# Patient Record
Sex: Female | Born: 1965 | Race: White | Hispanic: No | Smoking: Never smoker
Health system: Southern US, Community
[De-identification: ages and names within clinical notes are randomized; demographics above are authoritative.]

## PROBLEM LIST (undated history)

## (undated) ENCOUNTER — Inpatient Hospital Stay (HOSPITAL_COMMUNITY): Payer: Self-pay

## (undated) DIAGNOSIS — Z789 Other specified health status: Secondary | ICD-10-CM

## (undated) HISTORY — PX: WISDOM TOOTH EXTRACTION: SHX21

---

## 2007-02-27 ENCOUNTER — Ambulatory Visit (HOSPITAL_COMMUNITY): Admission: RE | Admit: 2007-02-27 | Discharge: 2007-02-27 | Payer: Self-pay | Admitting: Obstetrics and Gynecology

## 2007-03-06 ENCOUNTER — Encounter: Admission: RE | Admit: 2007-03-06 | Discharge: 2007-03-06 | Payer: Self-pay | Admitting: Obstetrics and Gynecology

## 2007-03-06 ENCOUNTER — Encounter (INDEPENDENT_AMBULATORY_CARE_PROVIDER_SITE_OTHER): Payer: Self-pay | Admitting: Diagnostic Radiology

## 2009-08-09 ENCOUNTER — Encounter: Admission: RE | Admit: 2009-08-09 | Discharge: 2009-08-09 | Payer: Self-pay | Admitting: Obstetrics and Gynecology

## 2010-02-19 ENCOUNTER — Encounter: Payer: Self-pay | Admitting: Obstetrics and Gynecology

## 2010-08-30 ENCOUNTER — Other Ambulatory Visit: Payer: Self-pay | Admitting: Obstetrics and Gynecology

## 2010-08-30 DIAGNOSIS — N63 Unspecified lump in unspecified breast: Secondary | ICD-10-CM

## 2010-08-30 DIAGNOSIS — N644 Mastodynia: Secondary | ICD-10-CM

## 2010-09-06 ENCOUNTER — Ambulatory Visit
Admission: RE | Admit: 2010-09-06 | Discharge: 2010-09-06 | Disposition: A | Discharge status: Home / Self Care | Payer: BC Managed Care – PPO | Source: Ambulatory Visit | Attending: Obstetrics and Gynecology | Admitting: Obstetrics and Gynecology

## 2010-09-06 DIAGNOSIS — N644 Mastodynia: Secondary | ICD-10-CM

## 2010-09-06 DIAGNOSIS — N63 Unspecified lump in unspecified breast: Secondary | ICD-10-CM

## 2012-01-22 ENCOUNTER — Inpatient Hospital Stay (HOSPITAL_COMMUNITY)
Admission: AD | Admit: 2012-01-22 | Discharge: 2012-01-22 | Disposition: A | Discharge status: Home / Self Care | Payer: BC Managed Care – PPO | Source: Ambulatory Visit | Attending: Obstetrics and Gynecology | Admitting: Obstetrics and Gynecology

## 2012-01-22 ENCOUNTER — Ambulatory Visit: Payer: Self-pay

## 2012-01-22 ENCOUNTER — Encounter (HOSPITAL_COMMUNITY): Payer: Self-pay

## 2012-01-22 ENCOUNTER — Ambulatory Visit (INDEPENDENT_AMBULATORY_CARE_PROVIDER_SITE_OTHER): Payer: BC Managed Care – PPO | Admitting: Family Medicine

## 2012-01-22 ENCOUNTER — Inpatient Hospital Stay (HOSPITAL_COMMUNITY): Payer: BC Managed Care – PPO

## 2012-01-22 VITALS — BP 108/80 | HR 60 | Temp 98.0°F | Resp 20 | Ht 67.0 in | Wt 183.0 lb

## 2012-01-22 DIAGNOSIS — Z3490 Encounter for supervision of normal pregnancy, unspecified, unspecified trimester: Secondary | ICD-10-CM

## 2012-01-22 DIAGNOSIS — N926 Irregular menstruation, unspecified: Secondary | ICD-10-CM

## 2012-01-22 DIAGNOSIS — R109 Unspecified abdominal pain: Secondary | ICD-10-CM | POA: Insufficient documentation

## 2012-01-22 DIAGNOSIS — N939 Abnormal uterine and vaginal bleeding, unspecified: Secondary | ICD-10-CM

## 2012-01-22 DIAGNOSIS — N898 Other specified noninflammatory disorders of vagina: Secondary | ICD-10-CM

## 2012-01-22 DIAGNOSIS — O209 Hemorrhage in early pregnancy, unspecified: Secondary | ICD-10-CM | POA: Insufficient documentation

## 2012-01-22 DIAGNOSIS — O021 Missed abortion: Secondary | ICD-10-CM

## 2012-01-22 DIAGNOSIS — O00109 Unspecified tubal pregnancy without intrauterine pregnancy: Secondary | ICD-10-CM | POA: Insufficient documentation

## 2012-01-22 DIAGNOSIS — Z331 Pregnant state, incidental: Secondary | ICD-10-CM

## 2012-01-22 DIAGNOSIS — R102 Pelvic and perineal pain: Secondary | ICD-10-CM

## 2012-01-22 DIAGNOSIS — O039 Complete or unspecified spontaneous abortion without complication: Secondary | ICD-10-CM | POA: Insufficient documentation

## 2012-01-22 DIAGNOSIS — N949 Unspecified condition associated with female genital organs and menstrual cycle: Secondary | ICD-10-CM

## 2012-01-22 HISTORY — DX: Other specified health status: Z78.9

## 2012-01-22 LAB — POCT CBC
Granulocyte percent: 91.3 %G — AB (ref 37–80)
MCH, POC: 30.9 pg (ref 27–31.2)
MID (cbc): 0.4 (ref 0–0.9)
MPV: 8.4 fL (ref 0–99.8)
POC MID %: 2.1 %M (ref 0–12)
Platelet Count, POC: 281 10*3/uL (ref 142–424)
RBC: 4.21 M/uL (ref 4.04–5.48)
WBC: 17.6 10*3/uL — AB (ref 4.6–10.2)

## 2012-01-22 MED ORDER — DOXYCYCLINE HYCLATE 100 MG PO TABS
100.0000 mg | ORAL_TABLET | ORAL | Status: AC
  Administered 2012-01-22: 100 mg via ORAL
  Filled 2012-01-22: qty 1

## 2012-01-22 MED ORDER — MISOPROSTOL 200 MCG PO TABS
800.0000 ug | ORAL_TABLET | Freq: Once | ORAL | Status: AC
  Administered 2012-01-22: 800 ug via VAGINAL
  Filled 2012-01-22: qty 4

## 2012-01-22 MED ORDER — DOXYCYCLINE HYCLATE 100 MG PO TABS: 100.0000 mg | ORAL_TABLET | Freq: Two times a day (BID) | ORAL | Status: DC

## 2012-01-22 MED ORDER — KETOROLAC TROMETHAMINE 60 MG/2ML IM SOLN
60.0000 mg | INTRAMUSCULAR | Status: AC
  Administered 2012-01-22: 60 mg via INTRAMUSCULAR
  Filled 2012-01-22: qty 2

## 2012-01-22 MED ORDER — HYDROCODONE-ACETAMINOPHEN 5-500 MG PO TABS: 1.0000 | ORAL_TABLET | Freq: Four times a day (QID) | ORAL | Status: DC | PRN

## 2012-01-22 MED ORDER — IBUPROFEN 800 MG PO TABS: 800.0000 mg | ORAL_TABLET | Freq: Three times a day (TID) | ORAL | Status: DC | PRN

## 2012-01-22 NOTE — MAU Note (Signed)
Dr. Cherly Hensen returned page, pt report given, orders to obtain ABO/RH, BHCG, and u/s. Dr. Cherly Hensen to see pt in MAU.

## 2012-01-22 NOTE — Progress Notes (Signed)
sono showed 6 week fetal demise in LUS. Undesired pregnancy  IMP: Missed abortion Pt given option of surgical mgmt( D&E) vs medical mgmt w/ cytotec. Pt prefers the latter. Will have RN insert cytotec per vagina. SAB precautions reviewed. Will start on doxycycline 100  Mg po bid x 7 days. F/u office 12/30 for sono and /or h quant. Will reinitiate OCP when preg resolved. Script for vicodin and motrin,. Pt will be instructed on when 1st motrin can be taken post toradol use

## 2012-01-22 NOTE — MAU Note (Signed)
Vanessa Shields, CNM notified by RN that pt is here with early pregnancy pain and bleeding, sent from urgent care, RN unsure who will assume care. CNM notified RN to call Dr. Billy Coast, then Dr. Cherly Hensen if Dr. Billy Coast is unable to evaluate pt.

## 2012-01-22 NOTE — MAU Note (Signed)
Pt states irregular cycle for past 2 months, thought she was going into menopause, pain began at 1000 am, is constant. Went to [place] urgent care this am, and had pelvic exam done, multiple clots noted then, feels slightly better since that exam, however is guarding from pain. For past 2 months has had dark blood/spotting. Pain is constant, rates 7/10.

## 2012-01-22 NOTE — Progress Notes (Signed)
[address]   [place], [place]  [address]   [phone]  Subjective:    Patient ID: Vanessa Shields, female    DOB: [birth year], 46 y.o.   MRN: [id number]  HPIThis 46 y.o. female presents for evaluation of severe abdominal pain.  At grocery store with acute onset of B lower abdominal pain L>R pain.  No menses in months.  Spotting irregularly intermittently; went to bathroom and has been gushing vaginal blood; now is just stopped.  No fever but +chills; no sweats.  No nausea, vomiting, diarrhea.  Had B.M. This morning but rectum felt funny.  No constipation; b.m. Yesterday and normal.   No urinary symptoms; no hematuria, urgency, frequency,nocturia, incontinence.  Sexually active; no contraception.  Last normal menses 3-4 months ago; not normal; only spotting.  Changing positions a lot to get comfortable.  Appetite fine until onset of pain.  Severity 8-9/10.  No medication for pain.  No history of STDs in past; sexually active.  PCP:  Deboraha Sprang  Physician GYN:  Billy Coast   Review of Systems  Constitutional: Positive for chills and diaphoresis. Negative for fever and fatigue.  Respiratory: Negative for shortness of breath.   Cardiovascular: Negative for chest pain.  Gastrointestinal: Positive for abdominal pain. Negative for nausea, vomiting, diarrhea, constipation, blood in stool, anal bleeding and rectal pain.  Genitourinary: Positive for vaginal bleeding and pelvic pain. Negative for dysuria, urgency, frequency, hematuria, flank pain, vaginal discharge, genital sores and vaginal pain.    History reviewed. No pertinent past medical history.  History reviewed. No pertinent past surgical history.  Prior to Admission medications   Not on File    Allergies  Allergen Reactions  . Penicillins     History   Social History  . Marital Status: Divorced    Spouse Name: N/A    Number of Children: N/A  . Years of Education: N/A   Occupational History  . Not on file.   Social History Main  Topics  . Smoking status: Never Smoker   . Smokeless tobacco: Not on file  . Alcohol Use: No  . Drug Use: No  . Sexually Active: Yes   Other Topics Concern  . Not on file   Social History Narrative   Marital status: divorced; but dating   Children: 2 children (14, 68)   Employment: Runner, broadcasting/film/video.    History reviewed. No pertinent family history.      History reviewed. No pertinent past medical history.  History reviewed. No pertinent past surgical history.  Prior to Admission medications   Not on File    Allergies  Allergen Reactions  . Penicillins     History   Social History  . Marital Status: Divorced    Spouse Name: N/A    Number of Children: N/A  . Years of Education: N/A   Occupational History  . Not on file.   Social History Main Topics  . Smoking status: Never Smoker   . Smokeless tobacco: Not on file  . Alcohol Use: No  . Drug Use: No  . Sexually Active: Yes   Other Topics Concern  . Not on file   Social History Narrative   Marital status: divorced; but dating   Children: 2 children (14, 68)   Employment: Runner, broadcasting/film/video.    History reviewed. No pertinent family history.  Objective:   Physical Exam  Nursing note and vitals reviewed. Constitutional: She is oriented to person, place, and time. She appears well-developed and well-nourished. She appears distressed.  Changes position frequently during history collection.  Having difficult time getting comfortable.  Distressed due to pain.  Cardiovascular: Normal rate, regular rhythm and normal heart sounds.   Pulmonary/Chest: Effort normal and breath sounds normal.  Abdominal: Soft. Bowel sounds are normal. She exhibits no distension and no mass. There is no hepatosplenomegaly. There is tenderness in the right lower quadrant and left lower quadrant. There is no rebound, no guarding and no CVA tenderness. No hernia.  Genitourinary: There is no rash, tenderness or lesion on the right labia. There is no rash,  tenderness or lesion on the left labia. Uterus is enlarged and tender. Cervix exhibits no motion tenderness. Right adnexum displays no mass, no tenderness and no fullness. Left adnexum displays no mass, no tenderness and no fullness. There is bleeding around the vagina. No erythema or tenderness around the vagina. No foreign body around the vagina. No vaginal discharge found.       Large bleeding with insertion of speculum; passing multiple medium sized clots.  Difficulty visualizing os due to active continuous bleeding.  Uterus mildly tender to palpation; feels enlarged.  Neurological: She is alert and oriented to person, place, and time.  Skin: No rash noted. She is not diaphoretic.  Psychiatric: She has a normal mood and affect. Her behavior is normal.    Results for orders placed in visit on 01/22/12  POCT URINE PREGNANCY      Component Value Range   Preg Test, Ur Positive    POCT CBC      Component Value Range   WBC 17.6 (*) 4.6 - 10.2 K/uL   Lymph, poc 1.2  0.6 - 3.4   POC LYMPH PERCENT 6.6 (*) 10 - 50 %L   MID (cbc) 0.4  0 - 0.9   POC MID % 2.1  0 - 12 %M   POC Granulocyte 16.1 (*) 2 - 6.9   Granulocyte percent 91.3 (*) 37 - 80 %G   RBC 4.21  4.04 - 5.48 M/uL   Hemoglobin 13.0  12.2 - 16.2 g/dL   HCT, POC 78.2  95.6 - 47.9 %   MCV 99.3 (*) 80 - 97 fL   MCH, POC 30.9  27 - 31.2 pg   MCHC 31.1 (*) 31.8 - 35.4 g/dL   RDW, POC 21.3     Platelet Count, POC 281  142 - 424 K/uL   MPV 8.4  0 - 99.8 fL       Assessment & Plan:   1. Vaginal bleeding  POCT urine pregnancy, POCT CBC  2. Pelvic pain in female  POCT urine pregnancy, POCT CBC  3. Pregnancy    4. Irregular menses       1.  Pelvic pain:  New.  Associated with new onset vaginal bleeding, positive urine pregnancy test.  Refer to [facility] for stat pelvic ultrasound to evaluate for ectopic pregnancy.  Spoke with MAU; pt to present immediately for evaluation. 2.  Vaginal Bleeding:  New onset in office; associated  with pelvic pain, positive pregnancy test.  Refer for stat pelvic ultrasound to evaluate for ectopic pregnancy versus SAB. 3.  Pregnancy:  New.  With severe pelvic pain, bleeding. 4. Irregular menses: onset in past four months.  Positive pregnancy test.

## 2012-01-22 NOTE — MAU Provider Note (Addendum)
History   cc: abdominal pain, vaginal bleeding  Chief Complaint  Patient presents with  . Vaginal Bleeding  . Abdominal Pain    HPI: 46 yo [id number] DWF unsure LMP (+) UPT @ urgent care presents for further evaluation of lower abdominal pain and vaginal bleeding. Pt presented to urgent care for cc/o severe abdominal pain started today @ 10 am,. While she was being examined, pt had gush of vaginal bleeding and had been bleeding since that time. Pt was off OCp. She cannot recall last normal cycle. Pt has been having brown spotting for several months.  OB History    Grav Para Term Preterm Abortions TAB SAB Ect Mult Living   3 2 2       2       Past Medical History  Diagnosis Date  . No pertinent past medical history     Past Surgical History  Procedure Date  . Wisdom tooth extraction     History reviewed. No pertinent family history.  History  Substance Use Topics  . Smoking status: Never Smoker   . Smokeless tobacco: Never Used  . Alcohol Use: No    Allergies:  Allergies  Allergen Reactions  . Penicillins Other (See Comments)    unknown    No prescriptions prior to admission     Physical Exam   Blood pressure 97/47, pulse 68, temperature 97.1 F (36.2 C), temperature source Oral, resp. rate 16, height 5' 7.75" (1.721 m), weight 69.967 kg (154 lb 4 oz), last menstrual period 01/22/2012, SpO2 100.00%.  General appearance: alert, cooperative and mild distress Abdomen: soft tender active BS Pelvic: external genitalia normal and vagina: filled w/ blood and clot. cervix ft dilated w/ ? POC at os, uterus AV 7- week size adnexa nontender ED Course   Vaginal bleeding in preg ? Incomplete/impending SAB. R/o ectopic pregnancy P) Hquant, sonogram . ABO/RH typing  MDM   Vanessa Shields A, MD 4:02 PM 01/22/2012      addendum: blood type O positive. H quant 1667. Pt in sonogram

## 2012-01-22 NOTE — MAU Note (Signed)
Dr. Billy Coast notified of pt in MAU, Dr. Cherly Hensen is on call, will evaluate pt. Dr. Cherly Hensen paged.

## 2012-07-03 ENCOUNTER — Other Ambulatory Visit: Payer: Self-pay

## 2012-07-03 DIAGNOSIS — Z1231 Encounter for screening mammogram for malignant neoplasm of breast: Secondary | ICD-10-CM

## 2012-07-07 ENCOUNTER — Ambulatory Visit
Admission: RE | Admit: 2012-07-07 | Discharge: 2012-07-07 | Disposition: A | Discharge status: Home / Self Care | Payer: BC Managed Care – PPO | Source: Ambulatory Visit

## 2012-07-07 DIAGNOSIS — Z1231 Encounter for screening mammogram for malignant neoplasm of breast: Secondary | ICD-10-CM

## 2012-11-26 ENCOUNTER — Encounter (HOSPITAL_COMMUNITY): Payer: Self-pay | Admitting: *Deleted

## 2013-11-30 ENCOUNTER — Encounter (HOSPITAL_COMMUNITY): Payer: Self-pay | Admitting: *Deleted

## 2014-03-03 ENCOUNTER — Ambulatory Visit (INDEPENDENT_AMBULATORY_CARE_PROVIDER_SITE_OTHER): Payer: Self-pay | Admitting: Physician Assistant

## 2014-03-03 VITALS — BP 104/64 | HR 80 | Temp 98.1°F | Resp 16 | Ht 67.0 in | Wt 163.4 lb

## 2014-03-03 DIAGNOSIS — H00013 Hordeolum externum right eye, unspecified eyelid: Secondary | ICD-10-CM

## 2014-03-03 DIAGNOSIS — H109 Unspecified conjunctivitis: Secondary | ICD-10-CM

## 2014-03-03 MED ORDER — POLYMYXIN B-TRIMETHOPRIM 10000-0.1 UNIT/ML-% OP SOLN: 1.0000 [drp] | OPHTHALMIC | Status: AC

## 2014-03-03 NOTE — Patient Instructions (Signed)
Use eye drops every 4 hours for 7 days. Continue with warm compresses. Stop wearing contacts and throw them away. Wear glasses until symptoms resolve and put in new contacts at that time. Return with further problems.

## 2014-03-03 NOTE — Progress Notes (Signed)
Subjective:    Patient ID: Vanessa Shields, female    DOB: 06/28/1965, 49 y.o.   MRN: [id number]  HPI  This is a 49 year old female presenting with 5 days of right eyelid swelling and 1 day of left eyelid swelling. She reports the eyelids feel tender. Today her right conjunctiva became red and noticed this morning her right eye was crusted shut upon waking. She wears contacts and has continued to wear them during this time. She denies blurry vision, eye pain or itching, URI symptoms. She started using warm compresses today and states it made her eyelids better. Never had anything like this happen before. She denies fever or chills.  Review of Systems  Constitutional: Negative for fever and chills.  HENT: Negative for congestion, ear pain, sinus pressure and sore throat.   Eyes: Positive for discharge and redness. Negative for pain, itching and visual disturbance.  Respiratory: Negative for cough.   Skin: Positive for color change.  Neurological: Negative for headaches.  Hematological: Negative for adenopathy.    There are no active problems to display for this patient.  Prior to Admission medications   Medication Sig Start Date End Date Taking? Authorizing Provider  ALPRAZolam Prudy Feeler) 0.5 MG tablet Take 0.5 mg by mouth at bedtime as needed for anxiety.   Yes Historical Provider, MD  valACYclovir (VALTREX) 1000 MG tablet Take 1,000 mg by mouth 2 (two) times daily.   Yes Historical Provider, MD   Allergies  Allergen Reactions  . Penicillins Other (See Comments)    unknown   Patient's social and family history were reviewed.     Objective:   Physical Exam  Constitutional: She is oriented to person, place, and time. She appears well-developed and well-nourished. No distress.  HENT:  Head: Normocephalic and atraumatic.  Right Ear: Hearing, tympanic membrane, external ear and ear canal normal.  Left Ear: Hearing, tympanic membrane, external ear and ear canal normal.  Nose: Nose  normal. Right sinus exhibits no maxillary sinus tenderness and no frontal sinus tenderness. Left sinus exhibits no maxillary sinus tenderness and no frontal sinus tenderness.  Mouth/Throat: Uvula is midline, oropharynx is clear and moist and mucous membranes are normal.  Eyes: EOM are normal. Pupils are equal, round, and reactive to light. Right eye exhibits discharge. Left eye exhibits no discharge. Right conjunctiva is injected. Left conjunctiva is not injected. No scleral icterus.  Right eyelid erythematous and swollen with inward stye present. Right eye with purulent material in the medial corner. Left eyelid erythematous and swollen with no stye present. No purulent drainage noted from the left eye.  Cardiovascular: Normal rate and regular rhythm.   Pulmonary/Chest: Effort normal and breath sounds normal. No respiratory distress.  Musculoskeletal: Normal range of motion.  Lymphadenopathy:    She has no cervical adenopathy.  Neurological: She is alert and oriented to person, place, and time.  Skin: Skin is warm, dry and intact. No lesion and no rash noted.  Psychiatric: She has a normal mood and affect. Her speech is normal and behavior is normal. Thought content normal.    Visual Acuity Screening   Right eye Left eye Both eyes  Without correction:     With correction:    BP 104/64 mmHg  Pulse 80  Temp(Src) 98.1 F (36.7 C) (Oral)  Resp 16  Ht  (1.702 m)  Wt 163 lb 6.4 oz (74.118 kg)  BMI 25.59 kg/m2  SpO2 98%     Assessment & Plan:  1. Bilateral conjunctivitis 2. Stye, right Pt likely has bilateral bacterial conjunctivitis and right stye. Will treat with abx eye drops and warm compresses. Advised she throw away current contacts and wear glasses until symptoms resolve.  - trimethoprim-polymyxin b (POLYTRIM) ophthalmic solution; Place 1 drop into both eyes every 4 (four) hours.  Dispense: 10 mL; Refill: 0    Roswell MinersNicole V. Dyke BrackettBush, PA-C, MHS [facility]  03/03/2014

## 2014-05-05 ENCOUNTER — Ambulatory Visit (INDEPENDENT_AMBULATORY_CARE_PROVIDER_SITE_OTHER): Payer: BLUE CROSS/BLUE SHIELD | Admitting: Physician Assistant

## 2014-05-05 VITALS — BP 118/68 | HR 71 | Temp 98.7°F | Resp 18 | Ht 69.0 in | Wt 161.0 lb

## 2014-05-05 DIAGNOSIS — B9789 Other viral agents as the cause of diseases classified elsewhere: Principal | ICD-10-CM

## 2014-05-05 DIAGNOSIS — J069 Acute upper respiratory infection, unspecified: Secondary | ICD-10-CM

## 2014-05-05 MED ORDER — LORATADINE-PSEUDOEPHEDRINE ER 5-120 MG PO TB12: 1.0000 | ORAL_TABLET | Freq: Two times a day (BID) | ORAL | Status: AC

## 2014-05-05 MED ORDER — HYDROCODONE-HOMATROPINE 5-1.5 MG/5ML PO SYRP: 5.0000 mL | ORAL_SOLUTION | Freq: Three times a day (TID) | ORAL | Status: AC | PRN

## 2014-05-05 MED ORDER — NAPROXEN 500 MG PO TABS: 500.0000 mg | ORAL_TABLET | Freq: Two times a day (BID) | ORAL | Status: AC

## 2014-05-05 NOTE — Progress Notes (Signed)
05/05/2014 at 8:07 PM  Vanessa Shields / DOB: 1965-04-08 / MRN: [id number]  The patient  does not have a problem list on file.  SUBJECTIVE  Sore Throat  This is a new problem. The current episode started in the past 7 days. The problem has been unchanged. Neither side of throat is experiencing more pain than the other. There has been no fever. The pain is moderate. Associated symptoms include congestion, coughing and headaches. Pertinent negatives include no abdominal pain, hoarse voice, neck pain, shortness of breath, swollen glands, trouble swallowing or vomiting. She has had no exposure to strep or mono. She has tried gargles and NSAIDs for the symptoms. The treatment provided significant relief.  Cough Associated symptoms include headaches. Pertinent negatives include no shortness of breath.     She  has a past medical history of No pertinent past medical history.    She  has a current medication list which includes the following prescription(s): alprazolam, valacyclovir, hydrocodone-homatropine, loratadine-pseudoephedrine, and naproxen.  Ms. Vanessa Shields is allergic to penicillins. She  reports that she has never smoked. She has never used smokeless tobacco. She reports that she does not drink alcohol or use illicit drugs. She  reports that she currently engages in sexual activity. She reports using the following method of birth control/protection: None. The patient  has past surgical history that includes Wisdom tooth extraction.  Her family history is not on file.  Review of Systems  HENT: Positive for congestion. Negative for hoarse voice and trouble swallowing.   Respiratory: Positive for cough. Negative for shortness of breath.   Gastrointestinal: Negative for vomiting and abdominal pain.  Musculoskeletal: Negative for neck pain.  Neurological: Positive for headaches.    OBJECTIVE  Her  height is 5\' 9"  (1.753 m) and weight is 161 lb (73.029 kg). Her oral temperature is 98.7 F (37.1  C). Her blood pressure is 118/68 and her pulse is 71. Her respiration is 18 and oxygen saturation is 98%.  The patient's body mass index is 23.76 kg/(m^2).  Physical Exam  Constitutional: She is oriented to person, place, and time. She appears well-developed and well-nourished. No distress.  HENT:  Right Ear: Hearing, tympanic membrane, external ear and ear canal normal.  Left Ear: Hearing, tympanic membrane, external ear and ear canal normal.  Nose: Mucosal edema present. Right sinus exhibits no maxillary sinus tenderness and no frontal sinus tenderness. Left sinus exhibits no maxillary sinus tenderness and no frontal sinus tenderness.  Mouth/Throat: Uvula is midline, oropharynx is clear and moist and mucous membranes are normal.  Cardiovascular: Normal rate, regular rhythm and normal heart sounds.   Respiratory: Effort normal and breath sounds normal. She has no wheezes. She has no rales.  Neurological: She is alert and oriented to person, place, and time.  Skin: Skin is warm and dry. She is not diaphoretic.  Psychiatric: She has a normal mood and affect.    No results found for this or any previous visit (from the past 24 hour(s)).  ASSESSMENT & PLAN  Vanessa Shields was seen today for sore throat, cough and chills.  Diagnoses and all orders for this visit:  Viral URI with cough Orders: -     loratadine-pseudoephedrine (CLARITIN-D 12 HOUR) 5-120 MG per tablet; Take 1 tablet by mouth 2 (two) times daily. -     naproxen (NAPROSYN) 500 MG tablet; Take 1 tablet (500 mg total) by mouth 2 (two) times daily with a meal. -     HYDROcodone-homatropine (HYCODAN) 5-1.5 MG/5ML syrup;  Take 5 mLs by mouth every 8 (eight) hours as needed for cough.    The patient was advised to call or come back to clinic if she does not see an improvement in symptoms, or worsens with the above plan.   Deliah Boston, MHS, PA-C [facility] 05/05/2014 8:07 PM

## 2014-05-05 NOTE — Patient Instructions (Signed)
Upper Respiratory Infection, Adult An upper respiratory infection (URI) is also sometimes known as the common cold. The upper respiratory tract includes the nose, sinuses, throat, trachea, and bronchi. Bronchi are the airways leading to the lungs. Most people improve within 1 week, but symptoms can last up to 2 weeks. A residual cough may last even longer.  CAUSES Many different viruses can infect the tissues lining the upper respiratory tract. The tissues become irritated and inflamed and often become very moist. Mucus production is also common. A cold is contagious. You can easily spread the virus to others by oral contact. This includes kissing, sharing a glass, coughing, or sneezing. Touching your mouth or nose and then touching a surface, which is then touched by another person, can also spread the virus. SYMPTOMS  Symptoms typically develop 1 to 3 days after you come in contact with a cold virus. Symptoms vary from person to person. They may include:  Runny nose.  Sneezing.  Nasal congestion.  Sinus irritation.  Sore throat.  Loss of voice (laryngitis).  Cough.  Fatigue.  Muscle aches.  Loss of appetite.  Headache.  Low-grade fever. DIAGNOSIS  You might diagnose your own cold based on familiar symptoms, since most people get a cold 2 to 3 times a year. Your caregiver can confirm this based on your exam. Most importantly, your caregiver can check that your symptoms are not due to another disease such as strep throat, sinusitis, pneumonia, asthma, or epiglottitis. Blood tests, throat tests, and X-rays are not necessary to diagnose a common cold, but they may sometimes be helpful in excluding other more serious diseases. Your caregiver will decide if any further tests are required. RISKS AND COMPLICATIONS  You may be at risk for a more severe case of the common cold if you smoke cigarettes, have chronic heart disease (such as heart failure) or lung disease (such as asthma), or if  you have a weakened immune system. The very young and very old are also at risk for more serious infections. Bacterial sinusitis, middle ear infections, and bacterial pneumonia can complicate the common cold. The common cold can worsen asthma and chronic obstructive pulmonary disease (COPD). Sometimes, these complications can require emergency medical care and may be life-threatening. PREVENTION  The best way to protect against getting a cold is to practice good hygiene. Avoid oral or hand contact with people with cold symptoms. Wash your hands often if contact occurs. There is no clear evidence that vitamin C, vitamin E, echinacea, or exercise reduces the chance of developing a cold. However, it is always recommended to get plenty of rest and practice good nutrition. TREATMENT  Treatment is directed at relieving symptoms. There is no cure. Antibiotics are not effective, because the infection is caused by a virus, not by bacteria. Treatment may include:  Increased fluid intake. Sports drinks offer valuable electrolytes, sugars, and fluids.  Breathing heated mist or steam (vaporizer or shower).  Eating chicken soup or other clear broths, and maintaining good nutrition.  Getting plenty of rest.  Using gargles or lozenges for comfort.  Controlling fevers with ibuprofen or acetaminophen as directed by your caregiver.  Increasing usage of your inhaler if you have asthma. Zinc gel and zinc lozenges, taken in the first 24 hours of the common cold, can shorten the duration and lessen the severity of symptoms. Pain medicines may help with fever, muscle aches, and throat pain. A variety of non-prescription medicines are available to treat congestion and runny nose. Your caregiver   can make recommendations and may suggest nasal or lung inhalers for other symptoms.  HOME CARE INSTRUCTIONS   Only take over-the-counter or prescription medicines for pain, discomfort, or fever as directed by your  caregiver.  Use a warm mist humidifier or inhale steam from a shower to increase air moisture. This may keep secretions moist and make it easier to breathe.  Drink enough water and fluids to keep your urine clear or pale yellow.  Rest as needed.  Return to work when your temperature has returned to normal or as your caregiver advises. You may need to stay home longer to avoid infecting others. You can also use a face mask and careful hand washing to prevent spread of the virus. SEEK MEDICAL CARE IF:   After the first few days, you feel you are getting worse rather than better.  You need your caregiver's advice about medicines to control symptoms.  You develop chills, worsening shortness of breath, or brown or red sputum. These may be signs of pneumonia.  You develop yellow or brown nasal discharge or pain in the face, especially when you bend forward. These may be signs of sinusitis.  You develop a fever, swollen neck glands, pain with swallowing, or white areas in the back of your throat. These may be signs of strep throat. SEEK IMMEDIATE MEDICAL CARE IF:   You have a fever.  You develop severe or persistent headache, ear pain, sinus pain, or chest pain.  You develop wheezing, a prolonged cough, cough up blood, or have a change in your usual mucus (if you have chronic lung disease).  You develop sore muscles or a stiff neck. Document Released: 07/11/2000 Document Revised: 04/09/2011 Document Reviewed: 04/22/2013 Exit[employer] Patient Information 2015 [employer]. This information is not intended to replace advice given to you by your health care provider. Make sure you discuss any questions you have with your health care provider.  

## 2014-05-06 ENCOUNTER — Ambulatory Visit (INDEPENDENT_AMBULATORY_CARE_PROVIDER_SITE_OTHER): Payer: BLUE CROSS/BLUE SHIELD | Admitting: Family Medicine

## 2014-05-06 ENCOUNTER — Ambulatory Visit (INDEPENDENT_AMBULATORY_CARE_PROVIDER_SITE_OTHER): Payer: BLUE CROSS/BLUE SHIELD

## 2014-05-06 VITALS — BP 110/58 | HR 103 | Temp 100.4°F | Resp 20 | Ht 69.0 in | Wt 161.0 lb

## 2014-05-06 DIAGNOSIS — J029 Acute pharyngitis, unspecified: Secondary | ICD-10-CM | POA: Diagnosis not present

## 2014-05-06 DIAGNOSIS — R059 Cough, unspecified: Secondary | ICD-10-CM

## 2014-05-06 DIAGNOSIS — R509 Fever, unspecified: Secondary | ICD-10-CM

## 2014-05-06 DIAGNOSIS — R05 Cough: Secondary | ICD-10-CM

## 2014-05-06 DIAGNOSIS — J208 Acute bronchitis due to other specified organisms: Secondary | ICD-10-CM | POA: Diagnosis not present

## 2014-05-06 LAB — POCT CBC
GRANULOCYTE PERCENT: 90.8 % — AB (ref 37–80)
HEMATOCRIT: 42.6 % (ref 37.7–47.9)
Hemoglobin: 14 g/dL (ref 12.2–16.2)
Lymph, poc: 0.5 — AB (ref 0.6–3.4)
MCH, POC: 31.3 pg — AB (ref 27–31.2)
MCHC: 32.8 g/dL (ref 31.8–35.4)
MCV: 95.6 fL (ref 80–97)
MID (CBC): 0.1 (ref 0–0.9)
MPV: 7.2 fL (ref 0–99.8)
PLATELET COUNT, POC: 197 10*3/uL (ref 142–424)
POC Granulocyte: 5.7 (ref 2–6.9)
POC LYMPH %: 8.1 % — AB (ref 10–50)
POC MID %: 1.1 %M (ref 0–12)
RBC: 4.46 M/uL (ref 4.04–5.48)
RDW, POC: 13.2 %
WBC: 6.3 10*3/uL (ref 4.6–10.2)

## 2014-05-06 LAB — POCT RAPID STREP A (OFFICE): Rapid Strep A Screen: NEGATIVE

## 2014-05-06 MED ORDER — MAGIC MOUTHWASH W/LIDOCAINE: ORAL | Status: AC

## 2014-05-06 MED ORDER — AZITHROMYCIN 250 MG PO TABS: ORAL_TABLET | ORAL | Status: AC

## 2014-05-06 NOTE — Progress Notes (Signed)
Subjective: 49 year old lady who comes in here with a sore throat and fever and cough. She was here yesterday and treated for viral etiology. However the throat is gotten so much worse that she can hardly swallow. There is a cutting feeling like it was strep throat. She was exposed to someone up in [place], a foreign Therapist, [employer] student, few days ago over the weekend. She does not smoke. She teaches violin, has [employer], [employer] student. She has been febrile. She took some of the hydrocodone syrup this morning, and 201 Aleve this afternoon. Something for pain.  Objective: Ill-appearing. TMs normal. Throat little erythematous but no exudate. Neck supple. Some anterior cervical nodes. Chest has coarse rhonchi more in the right upper lung area some soft wheezes possible. She kept coughing and was hard to auscultate. Skin is warm to touch.  Assessment: Fever Sore throat Cough  Plan: Strep test, CBC, chest x-ray, throat culture if needed  Results for orders placed or performed in visit on 05/06/14  POCT CBC  Result Value Ref Range   WBC 6.3 4.6 - 10.2 K/uL   Lymph, poc 0.5 (A) 0.6 - 3.4   POC LYMPH PERCENT 8.1 (A) 10 - 50 %L   MID (cbc) 0.1 0 - 0.9   POC MID % 1.1 0 - 12 %M   POC Granulocyte 5.7 2 - 6.9   Granulocyte percent 90.8 (A) 37 - 80 %G   RBC 4.46 4.04 - 5.48 M/uL   Hemoglobin 14.0 12.2 - 16.2 g/dL   HCT, POC 40.942.6 81.137.7 - 47.9 %   MCV 95.6 80 - 97 fL   MCH, POC 31.3 (A) 27 - 31.2 pg   MCHC 32.8 31.8 - 35.4 g/dL   RDW, POC 91.413.2 %   Platelet Count, POC 197 142 - 424 K/uL   MPV 7.2 0 - 99.8 fL  POCT rapid strep A  Result Value Ref Range   Rapid Strep A Screen Negative Negative   [facility] reading (PRIMARY) by  Dr. Alwyn RenHopper Mild rml infiltrate  Bronchitis, poss. early pneumonitis.  Probably a viral process but did go ahead and give her azithromycin because of the little bit of infiltrate possible in the right middle lobe. She complains a lot of sore throat. Gave Magic mouthwash. Can take Tylenol  and/or ibuprofen. Rest. Fluids.

## 2014-05-06 NOTE — Patient Instructions (Addendum)
Drink plenty of fluids and get enough rest  Take the hydrocodone cough syrup 1 teaspoon every 4 hours as needed for cough  Use the Magic mouthwash 2 teaspoons swish and swallow or gargle and spit every 3-4 hours as needed for sore throat. Do not exceed 6 doses in 24 hours.  The azithromycin 2 pills initially, then 1 daily for 4 days  Take ibuprofen maximum 800 mg 3 times daily only if needed for fever and pain  Take Tylenol maximum 1000 mg 3 times daily only if needed for fever or pain  Return or go to the emergency room if worse  You can expect this to take several more days before it is really running its course.

## 2014-05-08 LAB — CULTURE, GROUP A STREP: Organism ID, Bacteria: NORMAL

## 2014-05-30 IMAGING — US US OB COMP LESS 14 WK
1 series · 14 of 28 positions shown · non-contrast
Comparison: None

CLINICAL DATA: Bleeding, pelvic pain, positive urine pregnancy
test

OBSTETRICAL ULTRASOUND <14 WKS AND TRANSVAGINAL OB US:

[Series 1: us ob comp less 14 wks · 48 acquisitions, 14 frames shown]
[im 2/48]
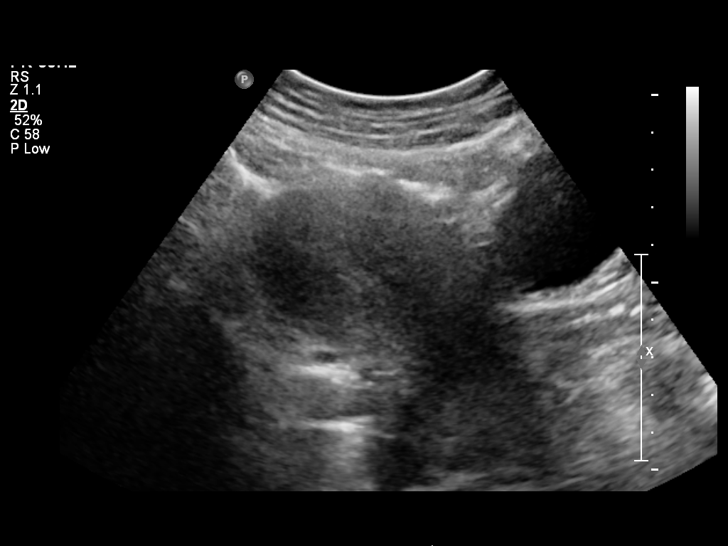
[im 6/48]
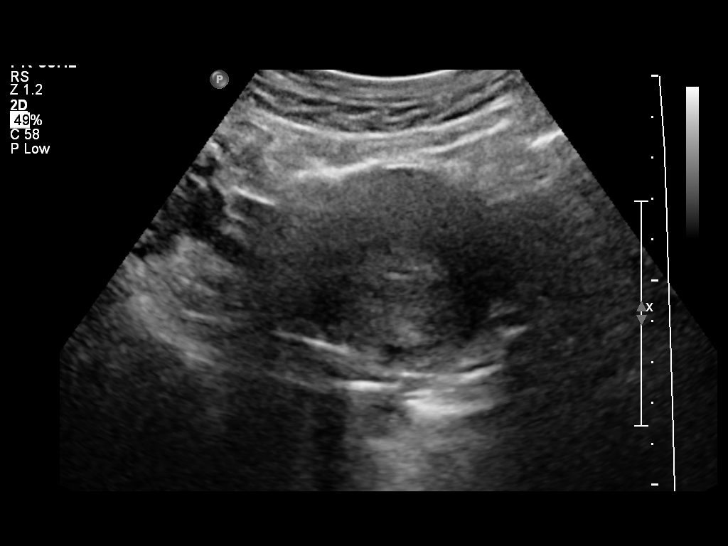
[im 9/48]
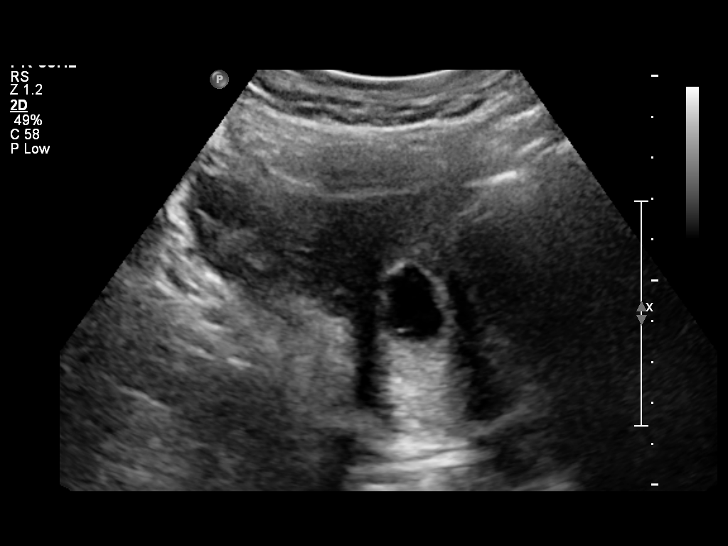
[im 13/48]
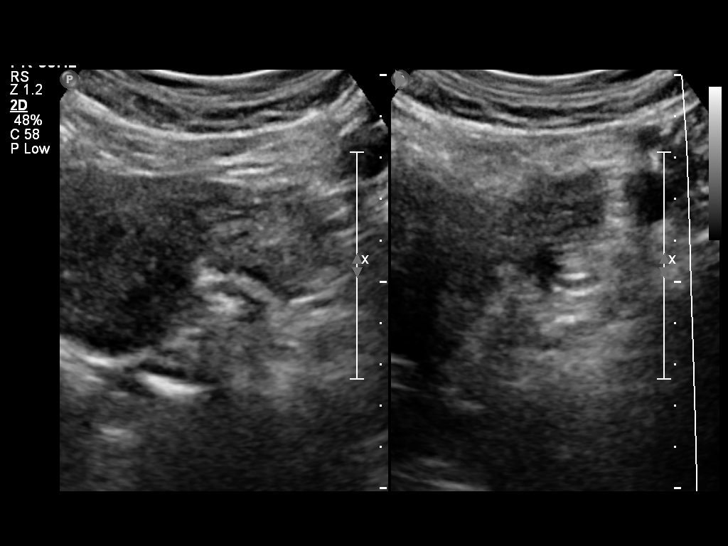
[im 16/48]
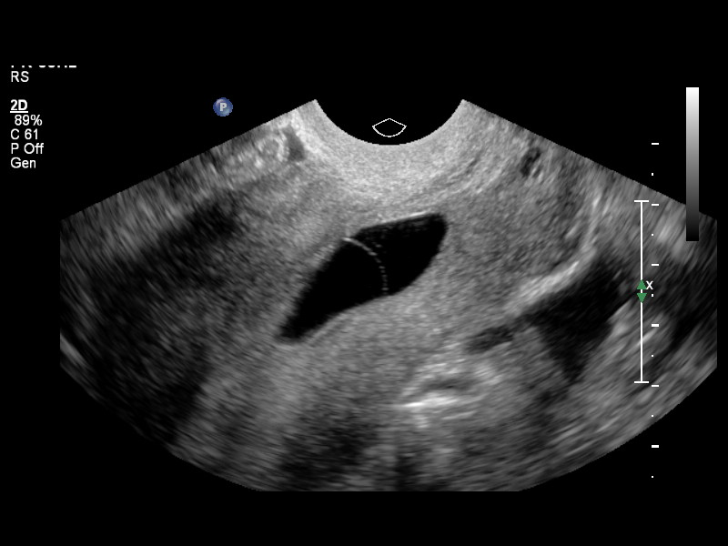
[im 20/48]
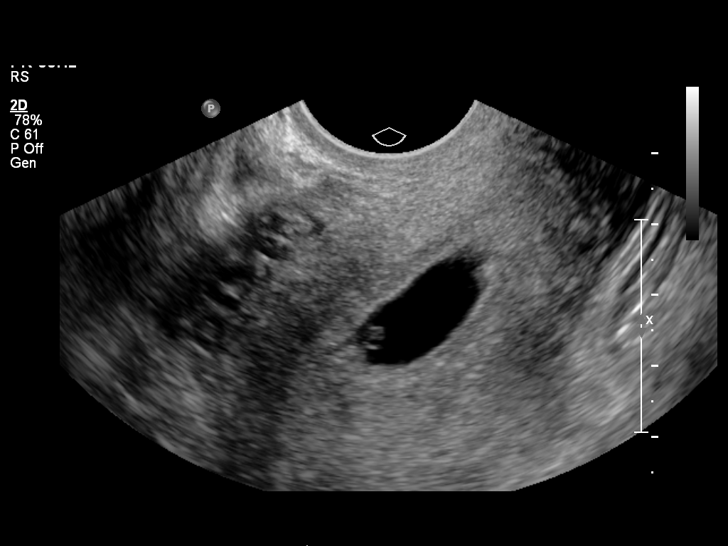
[im 23/48]
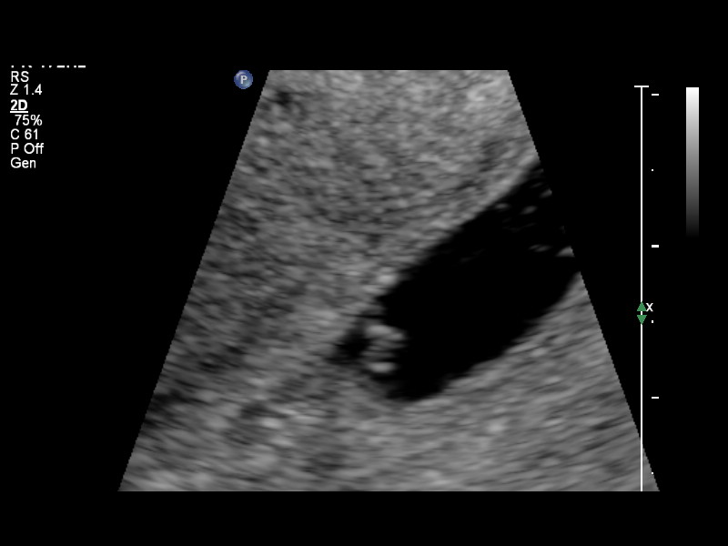
[im 27/48]
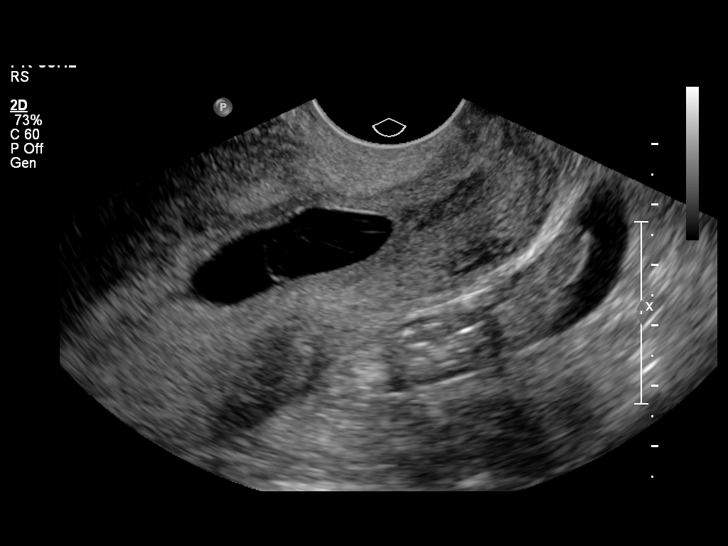
[im 30/48]
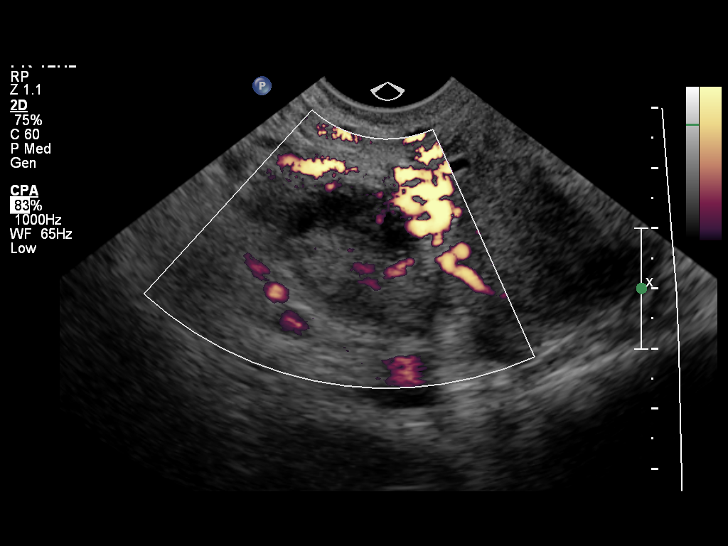
[im 34/48]
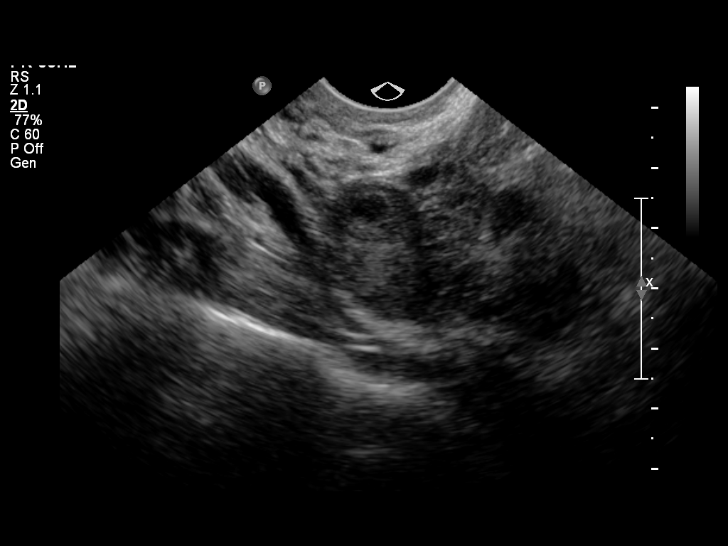
[im 37/48]
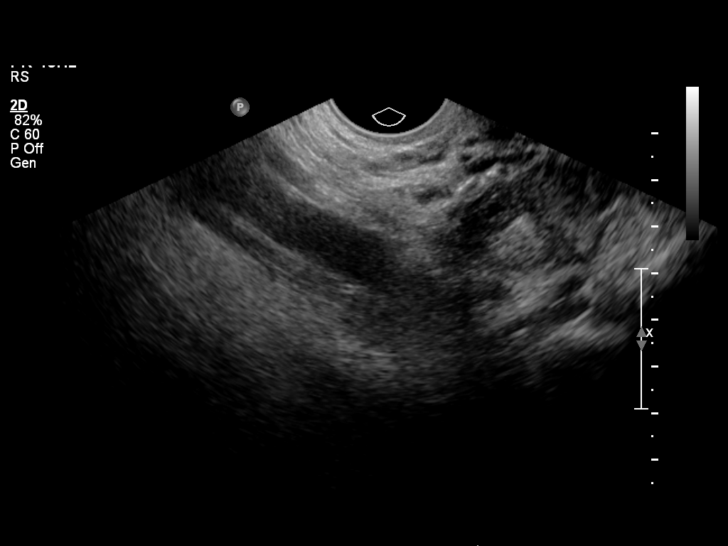
[im 41/48]
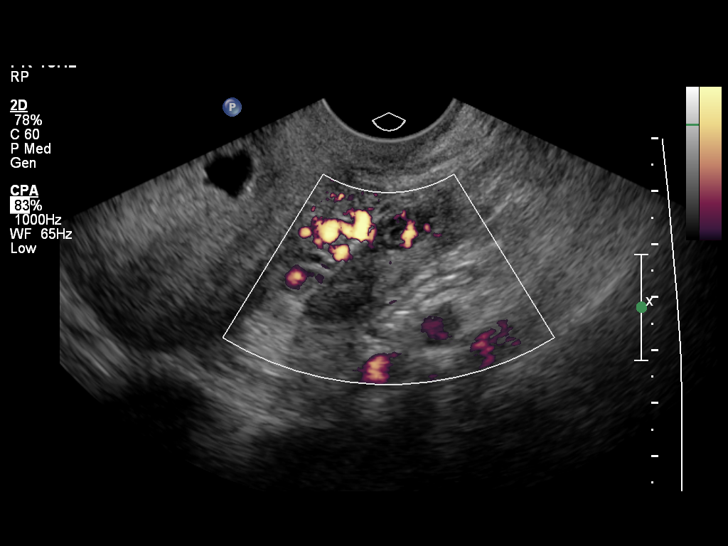
[im 44/48]
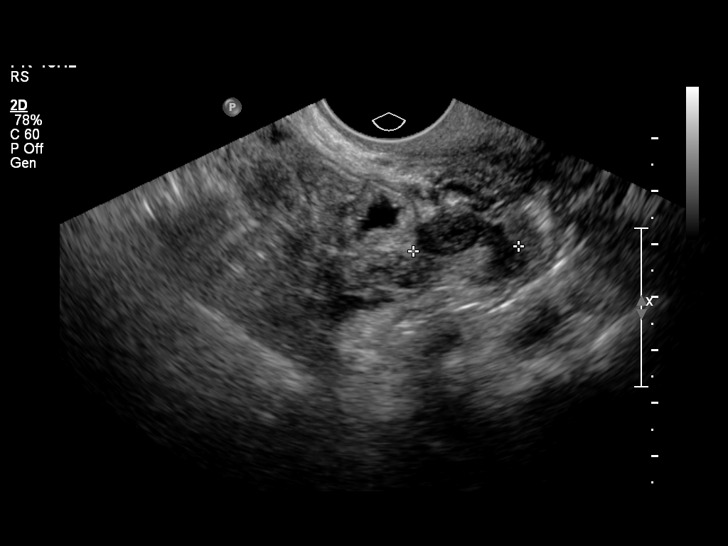
[im 48/48]
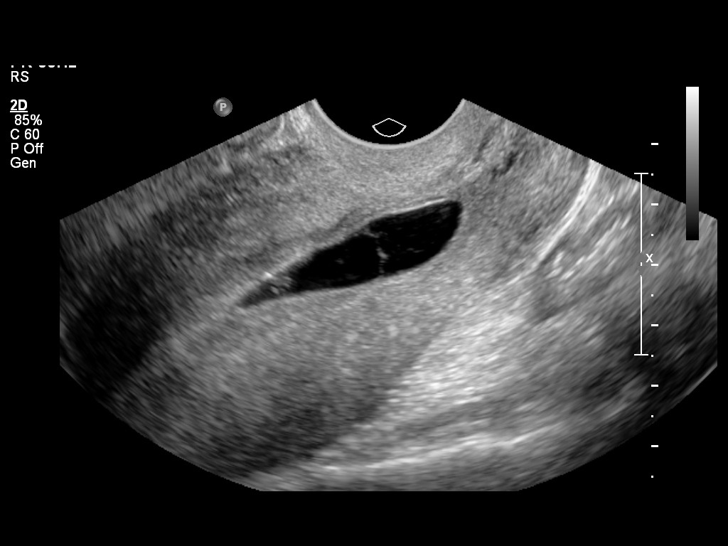

[14 of 28 positions shown; findings below may reference images not displayed]

Number of Fetuses: Single
Yolk Sac:  No
Embryo: Yes
Cardiac Activity: No
Heart Rate: Not applicablebpm

MEASUREMENTS:
Mean Sac Diameter:       w  d
CRL:                        0.33 cm      6w  0d       US EDC:

Subchorionic hemorrhage:

Maternal uterus/adnexae:
The ovaries appear normal.
IMPRESSION: The gestational sac is irregular, and it is located in the lower
uterine segment.  There is no fetal cardiac activity detected.
This may represent an early spontaneous AB but follow-up ultrasound
and beta HCG is recommended.

## 2016-09-11 IMAGING — CR DG CHEST 2V
2 series · 2 of 2 positions shown · non-contrast
Comparison: 06/16/2009.

CLINICAL DATA: Sore throat, fever, cough.

EXAM:
CHEST  2 VIEW

[PA]
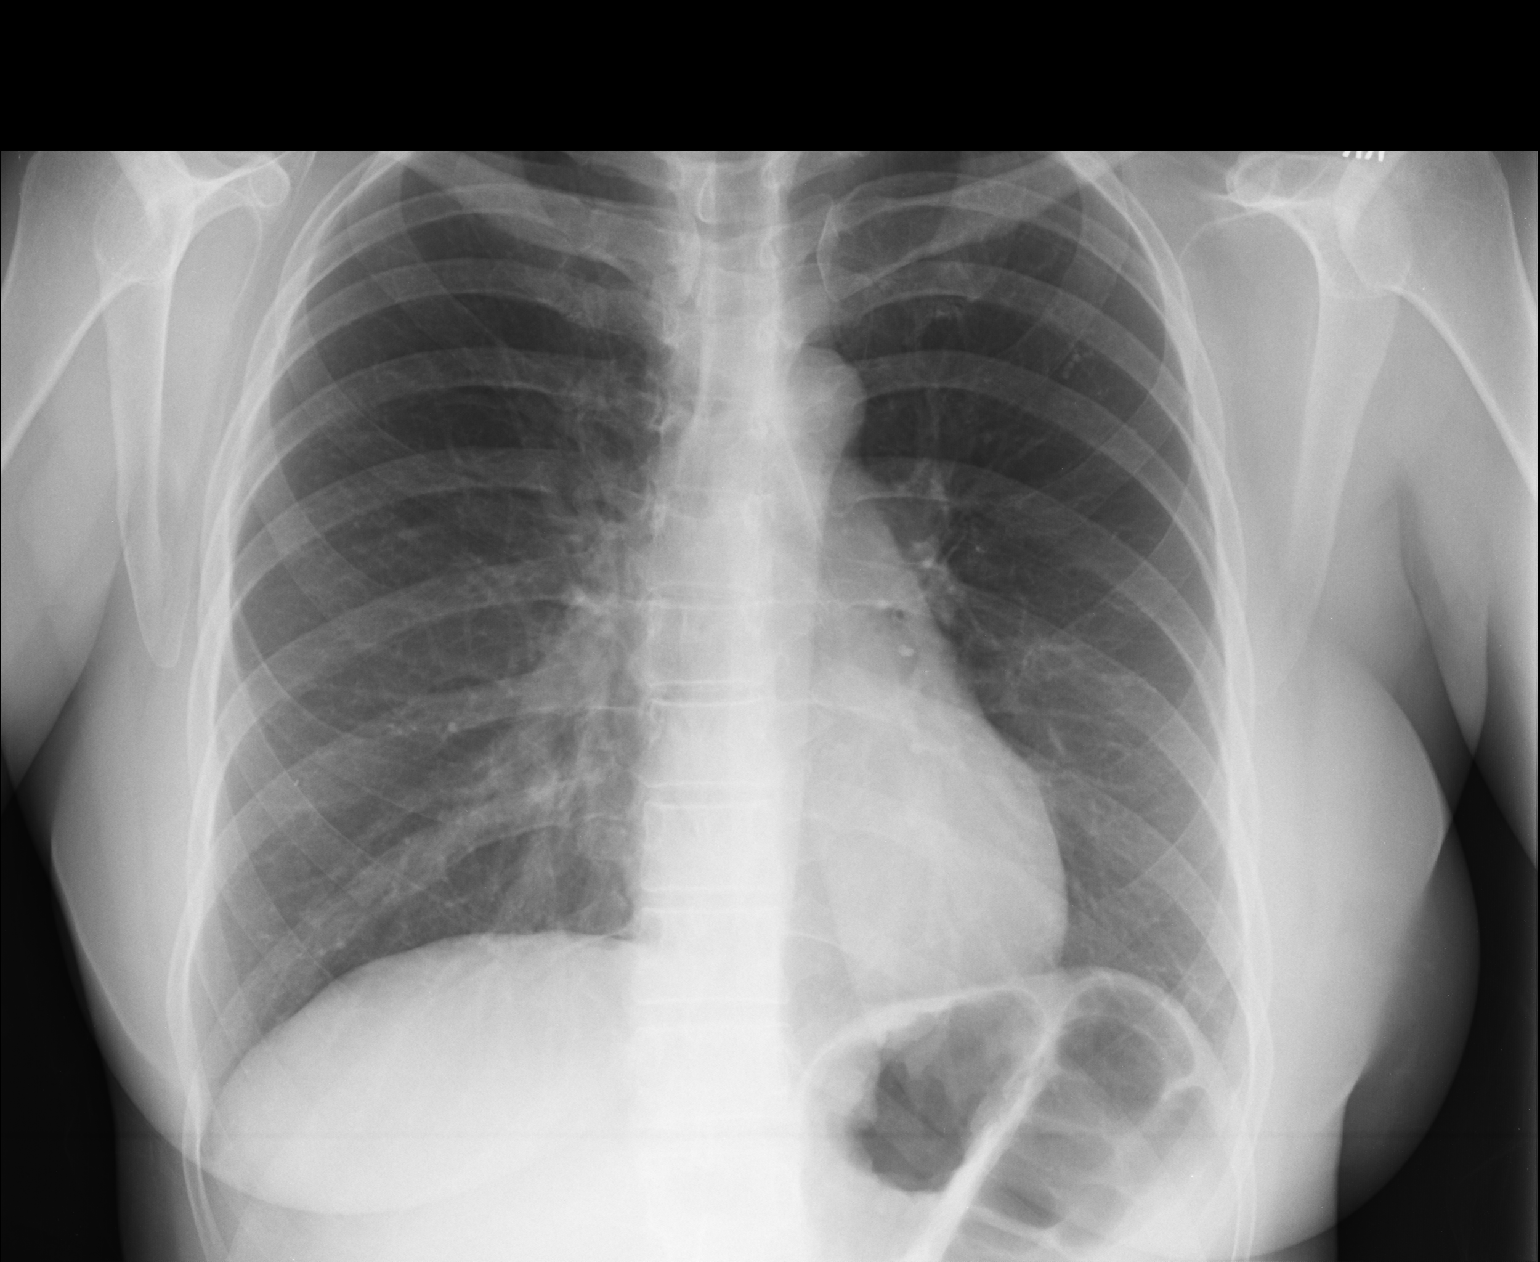

[lateral]
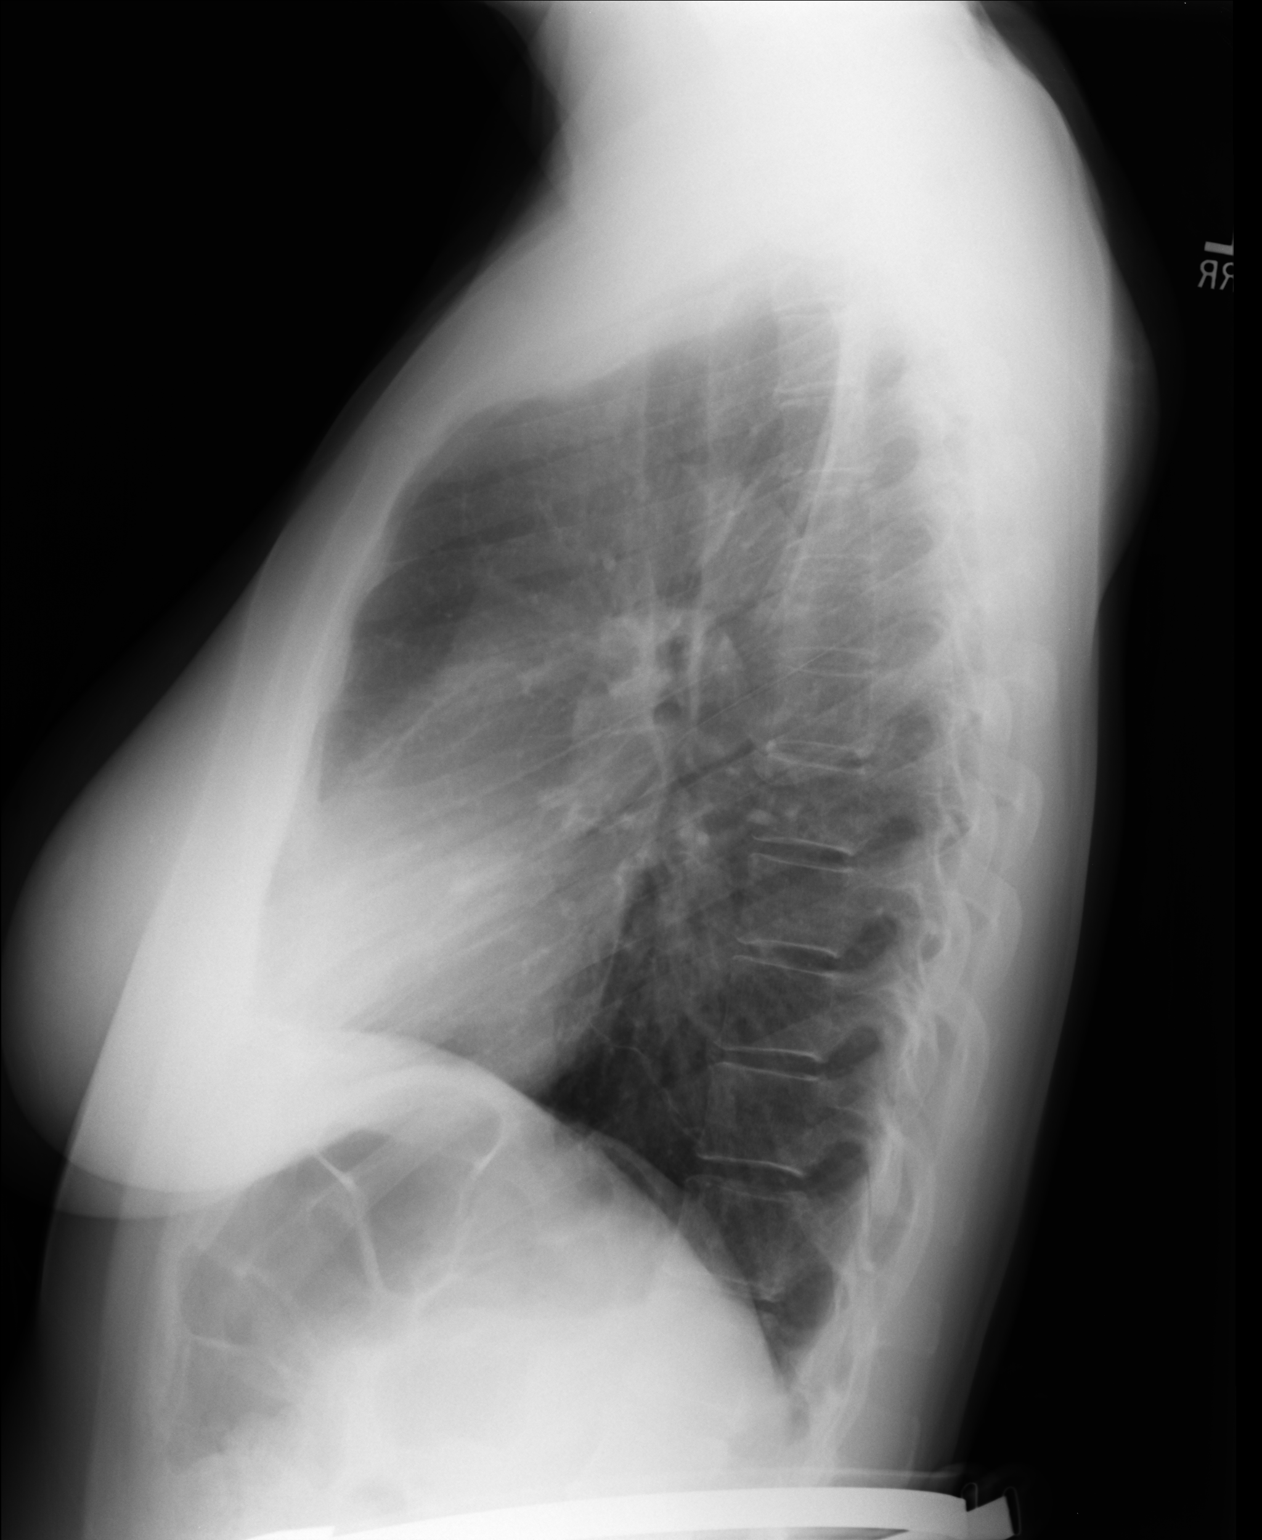

[2 of 2 positions shown; findings below may reference images not displayed]

FINDINGS: Mediastinum hilar structures normal. Heart size normal. Very subtle
lingular subsegmental atelectasis and or infiltrate cannot be
excluded. No prominent infiltrate. Tiny left pleural effusion cannot
be excluded . No pneumothorax.
IMPRESSION: Very subtle lingular subsegmental atelectasis and or infiltrate
cannot be excluded. Tiny left pleural effusion cannot be excluded.
